# Patient Record
Sex: Female | Born: 1990 | Race: Black or African American | Hispanic: No | Marital: Single | Smoking: Never smoker
Health system: Southern US, Community
[De-identification: ages and names within clinical notes are randomized; demographics above are authoritative.]

---
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
## 2016-07-25 ENCOUNTER — Emergency Department (HOSPITAL_BASED_OUTPATIENT_CLINIC_OR_DEPARTMENT_OTHER): Payer: Medicaid Other

## 2016-07-25 ENCOUNTER — Emergency Department (HOSPITAL_BASED_OUTPATIENT_CLINIC_OR_DEPARTMENT_OTHER)
Admission: EM | Admit: 2016-07-25 | Discharge: 2016-07-25 | Disposition: A | Discharge status: Home / Self Care | Payer: Medicaid Other | Attending: Emergency Medicine | Admitting: Emergency Medicine

## 2016-07-25 ENCOUNTER — Encounter (HOSPITAL_BASED_OUTPATIENT_CLINIC_OR_DEPARTMENT_OTHER): Payer: Self-pay | Admitting: Emergency Medicine

## 2016-07-25 DIAGNOSIS — Z3A01 Less than 8 weeks gestation of pregnancy: Secondary | ICD-10-CM | POA: Insufficient documentation

## 2016-07-25 DIAGNOSIS — O30041 Twin pregnancy, dichorionic/diamniotic, first trimester: Secondary | ICD-10-CM | POA: Insufficient documentation

## 2016-07-25 DIAGNOSIS — O9989 Other specified diseases and conditions complicating pregnancy, childbirth and the puerperium: Secondary | ICD-10-CM | POA: Diagnosis not present

## 2016-07-25 DIAGNOSIS — R109 Unspecified abdominal pain: Secondary | ICD-10-CM | POA: Diagnosis present

## 2016-07-25 LAB — URINALYSIS, ROUTINE W REFLEX MICROSCOPIC
Bilirubin Urine: NEGATIVE
Glucose, UA: NEGATIVE mg/dL
HGB URINE DIPSTICK: NEGATIVE
Ketones, ur: NEGATIVE mg/dL
LEUKOCYTES UA: NEGATIVE
Nitrite: NEGATIVE
Protein, ur: NEGATIVE mg/dL
SPECIFIC GRAVITY, URINE: 1.022 (ref 1.005–1.030)
pH: 6.5 (ref 5.0–8.0)

## 2016-07-25 LAB — WET PREP, GENITAL
Sperm: NONE SEEN
Trich, Wet Prep: NONE SEEN
Yeast Wet Prep HPF POC: NONE SEEN

## 2016-07-25 LAB — PREGNANCY, URINE: Preg Test, Ur: POSITIVE — AB

## 2016-07-25 MED ORDER — PRENATAL COMPLETE 14-0.4 MG PO TABS: 1.0000 | ORAL_TABLET | Freq: Every day | ORAL | 0 refills | Status: AC

## 2016-07-25 NOTE — Discharge Instructions (Signed)
Begin taking the prenatal vitamins. Please follow-up with the OB/GYN below or another one by calling the number circled below. Please return to the emergency department if you develop any new or worsening symptoms. Congratulations on your pregnancy!

## 2016-07-25 NOTE — ED Triage Notes (Signed)
PT presents to ED with c/o cramping and had a positive pregnancy test at home and now wants a second opinion .

## 2016-07-25 NOTE — ED Provider Notes (Signed)
[facility]   CSN: [id number] Arrival date & time: 07/25/16  1959   By signing my name below, I, Clarisse GougeXavier Herndon, attest that this documentation has been prepared under the direction and in the presence of Buel ReamAlexandra Danniel Grenz, PA-C. Electronically Signed: Clarisse GougeXavier Herndon, Scribe. 07/25/16. 9:33 PM.   History   Chief Complaint Chief Complaint  Patient presents with  . Possible Pregnancy  . Abdominal Pain   The history is provided by the patient and medical records. No language interpreter was used.    Leslie Lambert is a 26 y.o. female presenting to the Emergency Department with chief complaint of mild intermittent lower abdominal cramps. She notes N/V in the afternoons occurring 1-2 times per week. She states she missed her period for the month of 06/2016, she took an OTC pregnancy at home with positive results and she comes in for a second opinion. No vaginal bleeding, fever, chills, chest pain or SOB. LNMP 05/2016. G1P0.  History reviewed. No pertinent past medical history.  There are no active problems to display for this patient.   History reviewed. No pertinent surgical history.  OB History    No data available       Home Medications    Prior to Admission medications   Medication Sig Start Date End Date Taking? Authorizing Provider  Prenatal Vit-Fe Fumarate-FA (PRENATAL COMPLETE) 14-0.4 MG TABS Take 1 tablet by mouth daily. 07/25/16   Emi HolesLaw, Shawntrice Salle M, PA-C    Family History No family history on file.  Social History Social History  Substance Use Topics  . Smoking status: Not on file  . Smokeless tobacco: Not on file  . Alcohol use Not on file     Allergies   Patient has no known allergies.   Review of Systems Review of Systems  Constitutional: Negative for chills and fever.  Respiratory: Negative for shortness of breath.   Cardiovascular: Negative for chest pain.  Gastrointestinal: Positive for abdominal pain, nausea and vomiting.    Genitourinary: Negative for vaginal bleeding.  All other systems reviewed and are negative.    Physical Exam Updated Vital Signs BP (!) 144/79 (BP Location: Left Arm)   Pulse (!) 112   Temp 98.8 F (37.1 C) (Oral)   Resp 19   LMP 06/11/2016   SpO2 99%   Physical Exam  Constitutional: She appears well-developed and well-nourished. No distress.  HENT:  Head: Normocephalic and atraumatic.  Mouth/Throat: Oropharynx is clear and moist. No oropharyngeal exudate.  Eyes: Conjunctivae are normal. Pupils are equal, round, and reactive to light. Right eye exhibits no discharge. Left eye exhibits no discharge. No scleral icterus.  Neck: Normal range of motion. Neck supple. No thyromegaly present.  Cardiovascular: Normal rate, regular rhythm, normal heart sounds and intact distal pulses.  Exam reveals no gallop and no friction rub.   No murmur heard. Pulmonary/Chest: Effort normal and breath sounds normal. No stridor. No respiratory distress. She has no wheezes. She has no rales.  Abdominal: Soft. Bowel sounds are normal. She exhibits no distension. There is no tenderness. There is no rebound and no guarding.  Genitourinary: Uterus normal. Cervix exhibits no motion tenderness. Right adnexum displays no mass and no tenderness. Left adnexum displays no mass and no tenderness. Vaginal discharge (white) found.  Musculoskeletal: She exhibits no edema.  Lymphadenopathy:    She has no cervical adenopathy.  Neurological: She is alert. Coordination normal.  Skin: Skin is warm and dry. No rash noted. She is not diaphoretic. No pallor.  Psychiatric: She has a normal mood and affect.  Nursing note and vitals reviewed.    ED Treatments / Results  DIAGNOSTIC STUDIES: Oxygen Saturation is 99% on RA, NL by my interpretation.    COORDINATION OF CARE: 9:33 PM-Discussed next steps with pt. Pt verbalized understanding and is agreeable with the plan. Pt prepared for pelvic exam.   Labs (all labs ordered  are listed, but only abnormal results are displayed) Labs Reviewed  WET PREP, GENITAL - Abnormal; Notable for the following:       Result Value   Clue Cells Wet Prep HPF POC PRESENT (*)    WBC, Wet Prep HPF POC MODERATE (*)    All other components within normal limits  PREGNANCY, URINE - Abnormal; Notable for the following:    Preg Test, Ur POSITIVE (*)    All other components within normal limits  URINALYSIS, ROUTINE W REFLEX MICROSCOPIC  RPR  HIV ANTIBODY (ROUTINE TESTING)  GC/CHLAMYDIA PROBE AMP ([place]) NOT AT [facility]    EKG  EKG Interpretation None       Radiology US Ob Comp < 14 Wks  Result Date: 07/25/2016 CLINICAL DATA:  Diffuse midline cramping EXAM: TWIN OBSTETRIC <14WK [place] AND TRANSVAGINAL OB US COMPARISON:  None. FINDINGS: Number of IUPs:  2 Chorionicity/Amnionicity:  Dichorionic diamniotic TWIN 1 Yolk sac:  Visualized Embryo:  Visualized Cardiac Activity: Visualized Heart Rate: 124 bpm CRL:  3.9  mm   6 w 0 d                  [place] EDC: 03/20/2017 TWIN 2 Yolk sac:  Visualized Embryo:  Visualized Cardiac Activity: Visualized Heart Rate: 124 bpm CRL:  6.4  mm   6 w 3 d                  [place] EDC: 03/17/2017 Subchorionic hemorrhage: Small subchorionic hemorrhage along the right inferior aspect of the gestational sacs. Maternal uterus/adnexae: Right ovary measures 4.2 x 4.7 x 2.2 cm. Possible hemorrhagic luteal cyst with peripheral vascularity, measuring 2.3 x 1.8 x 2.1 cm. The left ovary measures 3.2 by 5.2 x 1.8 cm. Adjacent to the left ovary is a bilobed cystic structure measuring 2.3 x 1.2 by 1.1 cm. Trace amount of free fluid. IMPRESSION: 1. Viable intrauterine dichorionic, diamniotic twin pregnancy with measurements as above. 2. Small amount of subchorionic hemorrhage.  Trace free fluid 3. Bilobed cystic structure adjacent to left ovary measuring 2.3 cm, possibly representing para ovarian cysts. Electronically Signed   By: Jasmine Pang M.D.   On: 07/25/2016 21:35   US Ob Comp  Add'l Gest Less 14 Wks  Result Date: 07/25/2016 CLINICAL DATA:  Diffuse midline cramping EXAM: TWIN OBSTETRIC <14WK [place] AND TRANSVAGINAL OB US COMPARISON:  None. FINDINGS: Number of IUPs:  2 Chorionicity/Amnionicity:  Dichorionic diamniotic TWIN 1 Yolk sac:  Visualized Embryo:  Visualized Cardiac Activity: Visualized Heart Rate: 124 bpm CRL:  3.9  mm   6 w 0 d                  [place] EDC: 03/20/2017 TWIN 2 Yolk sac:  Visualized Embryo:  Visualized Cardiac Activity: Visualized Heart Rate: 124 bpm CRL:  6.4  mm   6 w 3 d                  [place] EDC: 03/17/2017 Subchorionic hemorrhage: Small subchorionic hemorrhage along the right inferior aspect of the gestational sacs. Maternal uterus/adnexae: Right ovary measures 4.2 x  4.7 x 2.2 cm. Possible hemorrhagic luteal cyst with peripheral vascularity, measuring 2.3 x 1.8 x 2.1 cm. The left ovary measures 3.2 by 5.2 x 1.8 cm. Adjacent to the left ovary is a bilobed cystic structure measuring 2.3 x 1.2 by 1.1 cm. Trace amount of free fluid. IMPRESSION: 1. Viable intrauterine dichorionic, diamniotic twin pregnancy with measurements as above. 2. Small amount of subchorionic hemorrhage.  Trace free fluid 3. Bilobed cystic structure adjacent to left ovary measuring 2.3 cm, possibly representing para ovarian cysts. Electronically Signed   By: Jasmine Pang M.D.   On: 07/25/2016 21:35   US Ob Transvaginal  Result Date: 07/25/2016 CLINICAL DATA:  Diffuse midline cramping EXAM: TWIN OBSTETRIC <14WK [place] AND TRANSVAGINAL OB US COMPARISON:  None. FINDINGS: Number of IUPs:  2 Chorionicity/Amnionicity:  Dichorionic diamniotic TWIN 1 Yolk sac:  Visualized Embryo:  Visualized Cardiac Activity: Visualized Heart Rate: 124 bpm CRL:  3.9  mm   6 w 0 d                  [place] EDC: 03/20/2017 TWIN 2 Yolk sac:  Visualized Embryo:  Visualized Cardiac Activity: Visualized Heart Rate: 124 bpm CRL:  6.4  mm   6 w 3 d                  [place] EDC: 03/17/2017 Subchorionic hemorrhage: Small subchorionic hemorrhage  along the right inferior aspect of the gestational sacs. Maternal uterus/adnexae: Right ovary measures 4.2 x 4.7 x 2.2 cm. Possible hemorrhagic luteal cyst with peripheral vascularity, measuring 2.3 x 1.8 x 2.1 cm. The left ovary measures 3.2 by 5.2 x 1.8 cm. Adjacent to the left ovary is a bilobed cystic structure measuring 2.3 x 1.2 by 1.1 cm. Trace amount of free fluid. IMPRESSION: 1. Viable intrauterine dichorionic, diamniotic twin pregnancy with measurements as above. 2. Small amount of subchorionic hemorrhage.  Trace free fluid 3. Bilobed cystic structure adjacent to left ovary measuring 2.3 cm, possibly representing para ovarian cysts. Electronically Signed   By: Jasmine Pang M.D.   On: 07/25/2016 21:35    Procedures Procedures (including critical care time)  Medications Ordered in ED Medications - No data to display   Initial Impression / Assessment and Plan / ED Course  I have reviewed the triage vital signs and the nursing notes.  Pertinent labs & imaging results that were available during my care of the patient were reviewed by me and considered in my medical decision making (see chart for details).     Patient with viable intrauterine chorionic, diamniotic 20 pregnancy at 6 weeks 3 days; small amount of subchorionic hemorrhage, trace free fluid; bilobed cystic structure adjacent to the left ovary injuring 2.3 cm, possibly representing paraovarian cysts. Pelvic exam not significant. Wet prep shows clue cells and moderate white blood cells. Patient reports no change in vaginal discharge, so will not treat clue cells at this time. GC/chlamydia, HIV, RPR sent and pending. UA is negative. Follow up to OB/GYN for further evaluation and prenatal care. Prenatal vitamins initiated. Return precautions discussed. Patient understands and agrees with plan. Patient vitals stable. ED course discharged in satisfactory condition.  Final Clinical Impressions(s) / ED Diagnoses   Final diagnoses:    Less than [redacted] weeks gestation of pregnancy    New Prescriptions Discharge Medication List as of 07/25/2016 10:53 PM    START taking these medications   Details  Prenatal Vit-Fe Fumarate-FA (PRENATAL COMPLETE) 14-0.4 MG TABS Take 1 tablet by mouth daily., Starting  Sat 07/25/2016, Print      I personally performed the services described in this documentation, which was scribed in my presence. The recorded information has been reviewed and is accurate.    Emi Holes, PA-C 07/25/16 2301    Arby Barrette, MD 07/31/16 [phone]

## 2016-07-27 LAB — GC/CHLAMYDIA PROBE AMP (~~LOC~~) NOT AT ARMC
Chlamydia: NEGATIVE
Neisseria Gonorrhea: NEGATIVE

## 2016-07-27 LAB — RPR: RPR Ser Ql: NONREACTIVE

## 2016-07-27 LAB — HIV ANTIBODY (ROUTINE TESTING W REFLEX): HIV Screen 4th Generation wRfx: NONREACTIVE

## 2019-02-14 ENCOUNTER — Encounter (HOSPITAL_BASED_OUTPATIENT_CLINIC_OR_DEPARTMENT_OTHER): Payer: Self-pay

## 2019-02-14 ENCOUNTER — Other Ambulatory Visit: Payer: Self-pay

## 2019-02-14 ENCOUNTER — Emergency Department (HOSPITAL_BASED_OUTPATIENT_CLINIC_OR_DEPARTMENT_OTHER)
Admission: EM | Admit: 2019-02-14 | Discharge: 2019-02-14 | Disposition: A | Discharge status: Home / Self Care | Payer: Self-pay | Attending: Emergency Medicine | Admitting: Emergency Medicine

## 2019-02-14 DIAGNOSIS — N898 Other specified noninflammatory disorders of vagina: Secondary | ICD-10-CM

## 2019-02-14 DIAGNOSIS — R300 Dysuria: Secondary | ICD-10-CM | POA: Insufficient documentation

## 2019-02-14 DIAGNOSIS — B3731 Acute candidiasis of vulva and vagina: Secondary | ICD-10-CM

## 2019-02-14 DIAGNOSIS — B373 Candidiasis of vulva and vagina: Secondary | ICD-10-CM | POA: Insufficient documentation

## 2019-02-14 LAB — URINALYSIS, ROUTINE W REFLEX MICROSCOPIC
Bilirubin Urine: NEGATIVE
Glucose, UA: NEGATIVE mg/dL
Ketones, ur: NEGATIVE mg/dL
Nitrite: NEGATIVE
Protein, ur: NEGATIVE mg/dL
Specific Gravity, Urine: >1.03 — ABNORMAL HIGH (ref 1.005–1.030)
pH: 6 (ref 5.0–8.0)

## 2019-02-14 LAB — PREGNANCY, URINE: Preg Test, Ur: NEGATIVE

## 2019-02-14 LAB — WET PREP, GENITAL
Clue Cells Wet Prep HPF POC: NONE SEEN
Sperm: NONE SEEN
Trich, Wet Prep: NONE SEEN

## 2019-02-14 LAB — URINALYSIS, MICROSCOPIC (REFLEX)

## 2019-02-14 MED ORDER — FLUCONAZOLE 150 MG PO TABS
150.0000 mg | ORAL_TABLET | Freq: Once | ORAL | Status: AC
  Administered 2019-02-14: 22:00:00 150 mg via ORAL
  Filled 2019-02-14: qty 1

## 2019-02-14 MED ORDER — HYDROXYZINE HCL 25 MG PO TABS
25.0000 mg | ORAL_TABLET | Freq: Once | ORAL | Status: AC
  Administered 2019-02-14: 22:00:00 25 mg via ORAL
  Filled 2019-02-14: qty 1

## 2019-02-14 MED ORDER — FLUCONAZOLE 150 MG PO TABS: 150.0000 mg | ORAL_TABLET | Freq: Once | ORAL | 0 refills | Status: AC

## 2019-02-14 NOTE — Discharge Instructions (Addendum)
You were seen in the ER for vaginal irritation and discharge.  Swabs showed yeast infection.  I suspect the external skin irritation is from the scratching that has caused more swelling and inflammation.  You were given a one-time dose of fluconazole here, I have sent a prescription for a second dose that may be taken in 72 hours if symptoms are not improving.  Can take Benadryl 25 mg every 6 hours for itching.  Do warm soaks, tap dry and apply Vaseline to the area.  Only use water to cleanse the area and avoid any soaps.  Loose clothing until the irritation improves.  Gonorrhea and Chlamydia testing is pending, you declined treatment today for these infections.  You can follow-up on the results on MyChart, if positive you need to seek treatment for these.  Do not have sexual encounters until your symptoms have completely resolved.  Return to the ER for worsening vaginal irritation or swelling, pelvic or abdominal pain, fevers

## 2019-02-14 NOTE — ED Triage Notes (Addendum)
Pt c/o vaginal discharge and irritation x today-used OTC suppository w/o relief-NAD-steady gait

## 2019-02-14 NOTE — ED Provider Notes (Signed)
[facility] EMERGENCY DEPARTMENT Provider Note   CSN: [id number] Arrival date & time: 02/14/19  2020     History Chief Complaint  Patient presents with  . Vaginal Discharge    Leslie Lambert is a 28 y.o. female presents to the ER for evaluation of vaginal itching associated with discharge.  Onset sunday, reportedly itching is very intense.  Was in the shower today and scrubbed vaginal area with a rag and soap to help with itching.  She put in a Monistat suppository and cream externally tonight and noticed sudden, worsening itching, burning discomfort and swelling of the external vaginal area.  Discharge is white, thick.  She is concerned about being allergic to Monistat.  Years ago had a similar reaction when she applied Monistat suppository.  Last menstrual period was 12/5.  She is sexually active with men only with inconsistent condom use.  Last time she was sexually active was earlier this month, vaginal intercourse.  Has history of BV and STI in October.  Was treated with Flagyl and Diflucan for trichomonas and states she had no issues with Diflucan.  Currently she has no concerns for STI.  No fever, abdominal pelvic pain.  Has some discomfort with urination but relates it to the skin irritation. HPI     History reviewed. No pertinent past medical history.  There are no problems to display for this patient.   History reviewed. No pertinent surgical history.   OB History   No obstetric history on file.     No family history on file.  Social History   Tobacco Use  . Smoking status: Never Smoker  . Smokeless tobacco: Never Used  Substance Use Topics  . Alcohol use: Yes    Comment: occ  . Drug use: Never    Home Medications Prior to Admission medications   Medication Sig Start Date End Date Taking? Authorizing Provider  fluconazole (DIFLUCAN) 150 MG tablet Take 1 tablet (150 mg total) by mouth once for 1 dose. 02/14/19 02/14/19  Kinnie Feil, PA-C    Prenatal Vit-Fe Fumarate-FA (PRENATAL COMPLETE) 14-0.4 MG TABS Take 1 tablet by mouth daily. 07/25/16   Frederica Kuster, PA-C    Allergies    Patient has no known allergies.  Review of Systems   Review of Systems  Genitourinary: Positive for difficulty urinating, dysuria, vaginal discharge and vaginal pain.  All other systems reviewed and are negative.   Physical Exam Updated Vital Signs BP 125/84 (BP Location: Left Arm)   Pulse 99   Temp 98.9 F (37.2 C) (Oral)   Resp 18   Ht 5\' 7"  (1.702 m)   Wt 88 kg   LMP 01/28/2019   SpO2 98%   BMI 30.38 kg/m   Physical Exam Vitals and nursing note reviewed.  Constitutional:      General: She is not in acute distress.    Appearance: She is well-developed.     Comments: NAD.  HENT:     Head: Normocephalic and atraumatic.     Right Ear: External ear normal.     Left Ear: External ear normal.     Nose: Nose normal.  Eyes:     General: No scleral icterus.    Conjunctiva/sclera: Conjunctivae normal.  Cardiovascular:     Rate and Rhythm: Normal rate and regular rhythm.     Heart sounds: Normal heart sounds. No murmur.  Pulmonary:     Effort: Pulmonary effort is normal.     Breath sounds: Normal breath  sounds. No wheezing.  Abdominal:     Palpations: Abdomen is soft.     Tenderness: There is no abdominal tenderness.     Comments: No lower abdominal pelvic tenderness.  No suprapubic or CVA tenderness.  Genitourinary:    Vagina: Vaginal discharge present.     Comments:  External genitalia skin is edematous, erythematous with excoriation marks and very tender.  Copious amounts of thick white discharge in the introitus noted.  Discomfort with speculum insertion but patient able to tolerate exam.  Copious amounts of thick white discharge in vaginal vault, covering the cervix.  Discharge was removed and cervical os was visualized, closed without any friability, lesions, bleeding or polyps.  Swabs obtained.  No CMT.  Nonpalpable,  nontender adnexa.  Perianal skin normal.  No groin lymphadenopathy. Musculoskeletal:        General: No deformity. Normal range of motion.     Cervical back: Normal range of motion and neck supple.  Skin:    General: Skin is warm and dry.     Capillary Refill: Capillary refill takes less than 2 seconds.  Neurological:     Mental Status: She is alert and oriented to person, place, and time.  Psychiatric:        Behavior: Behavior normal.        Thought Content: Thought content normal.        Judgment: Judgment normal.     ED Results / Procedures / Treatments   Labs (all labs ordered are listed, but only abnormal results are displayed) Labs Reviewed  WET PREP, GENITAL - Abnormal; Notable for the following components:      Result Value   Yeast Wet Prep HPF POC PRESENT (*)    WBC, Wet Prep HPF POC MANY (*)    All other components within normal limits  URINALYSIS, ROUTINE W REFLEX MICROSCOPIC - Abnormal; Notable for the following components:   APPearance CLOUDY (*)    Specific Gravity, Urine >1.030 (*)    Hgb urine dipstick MODERATE (*)    Leukocytes,Ua MODERATE (*)    All other components within normal limits  URINALYSIS, MICROSCOPIC (REFLEX) - Abnormal; Notable for the following components:   Bacteria, UA MANY (*)    All other components within normal limits  URINE CULTURE  PREGNANCY, URINE  GC/CHLAMYDIA PROBE AMP ([place]) NOT AT [facility]    EKG None  Radiology No results found.  Procedures Procedures (including critical care time)  Medications Ordered in ED Medications  fluconazole (DIFLUCAN) tablet 150 mg (150 mg Oral Given 02/14/19 2222)  hydrOXYzine (ATARAX/VISTARIL) tablet 25 mg (25 mg Oral Given 02/14/19 2222)    ED Course  I have reviewed the triage vital signs and the nursing notes.  Pertinent labs & imaging results that were available during my care of the patient were reviewed by me and considered in my medical decision making (see chart for  details).  Clinical Course as of Feb 13 2221  Tue Feb 14, 2019  2205 Yeast Wet Prep HPF POC(!): PRESENT [CG]  2205 WBC, Wet Prep HPF POC(!): MANY [CG]  2217 Yeast Wet Prep HPF POC(!): PRESENT [CG]  2217 WBC, Wet Prep HPF POC(!): MANY [CG]  2217 Glori Luis): MODERATE [CG]  2217 Bacteria, UA(!): MANY [CG]  2217 Squamous Epithelial / LPF: 0-5 [CG]  2217 Urine-Other: LESS THAN 10 mL OF URINE SUBMITTED [CG]  2217 Preg Test, Ur: NEGATIVE [CG]    Clinical Course User Index [CG] Liberty Handy, PA-C   MDM Rules/Calculators/A&P  History, exam consistent with yeast vaginitis.  Wet prep confirms this.  She is sexually active with inconsistent condom use but states she is not concerned about an STI today.  Gonorrhea and chlamydia swabs obtained and pending at discharge.  She declined empiric treatment.  No CMT or adnexal fullness or tenderness.  I think her dysuria is related to the external genitalia inflammation, excoriation from her scratching earlier today.  No suprapubic or CVA tenderness.  UA has moderate leukocytes and many bacteria but this is likely contamination from vaginal discharge.  Will send urine culture.  No indication for further emergent lab work, imaging.  Will recommend symptomatic management Benadryl for itching, warm soaks, Vaseline.  First dose of fluconazole given here, prescription for repeat dose in 72 hours for persistent symptoms.  Return precautions given.  She is comfortable this. Final Clinical Impression(s) / ED Diagnoses Final diagnoses:  Yeast vaginitis  Vaginal irritation    Rx / DC Orders ED Discharge Orders         Ordered    fluconazole (DIFLUCAN) 150 MG tablet   Once     02/14/19 2222           Jerrell MylarGibbons, Jaelin Devincentis J, PA-C 02/14/19 2224    Benjiman CorePickering, Nathan, MD 02/14/19 2300

## 2019-02-16 LAB — GC/CHLAMYDIA PROBE AMP (~~LOC~~) NOT AT ARMC
Chlamydia: NEGATIVE
Neisseria Gonorrhea: NEGATIVE

## 2019-02-16 LAB — URINE CULTURE: Culture: 50000 — AB

## 2019-07-30 ENCOUNTER — Encounter (HOSPITAL_BASED_OUTPATIENT_CLINIC_OR_DEPARTMENT_OTHER): Payer: Self-pay | Admitting: Emergency Medicine

## 2019-07-30 ENCOUNTER — Emergency Department (HOSPITAL_BASED_OUTPATIENT_CLINIC_OR_DEPARTMENT_OTHER): Payer: Self-pay

## 2019-07-30 ENCOUNTER — Emergency Department (HOSPITAL_BASED_OUTPATIENT_CLINIC_OR_DEPARTMENT_OTHER)
Admission: EM | Admit: 2019-07-30 | Discharge: 2019-07-30 | Disposition: A | Discharge status: Home / Self Care | Payer: Self-pay | Attending: Emergency Medicine | Admitting: Emergency Medicine

## 2019-07-30 ENCOUNTER — Other Ambulatory Visit: Payer: Self-pay

## 2019-07-30 DIAGNOSIS — M25562 Pain in left knee: Secondary | ICD-10-CM | POA: Insufficient documentation

## 2019-07-30 DIAGNOSIS — Y9389 Activity, other specified: Secondary | ICD-10-CM | POA: Insufficient documentation

## 2019-07-30 DIAGNOSIS — Y998 Other external cause status: Secondary | ICD-10-CM | POA: Insufficient documentation

## 2019-07-30 DIAGNOSIS — S8992XA Unspecified injury of left lower leg, initial encounter: Secondary | ICD-10-CM

## 2019-07-30 DIAGNOSIS — W1830XA Fall on same level, unspecified, initial encounter: Secondary | ICD-10-CM | POA: Insufficient documentation

## 2019-07-30 DIAGNOSIS — Y9289 Other specified places as the place of occurrence of the external cause: Secondary | ICD-10-CM | POA: Insufficient documentation

## 2019-07-30 MED ORDER — IBUPROFEN 400 MG PO TABS
600.0000 mg | ORAL_TABLET | Freq: Once | ORAL | Status: AC
  Administered 2019-07-30: 600 mg via ORAL
  Filled 2019-07-30: qty 1

## 2019-07-30 NOTE — Discharge Instructions (Addendum)
There is concern for possible ligament injury in your knee.  Please avoid weight bearing, wear the brace at all times expect while bathing. Apply ice for 20 minutes at a time and elevate your leg as much as possible. Take 600mg  of ibuprofen every 6 hours for pain and swelling. Schedule an appointment with orthopedics for follow up of your injury.

## 2019-07-30 NOTE — ED Provider Notes (Signed)
[facility] EMERGENCY DEPARTMENT Provider Note   CSN: [id number] Arrival date & time: 07/30/19  1238     History Chief Complaint  Patient presents with  . Knee Injury    Leslie Lambert is a 29 y.o. female presenting to the emergency department with left knee injury that occurred prior to arrival.  Patient states she was stepping down off of the porch when she fell.  Her leg folded under her with her knee bent as if she were to sit down with her legs crossed.  She states that she heard a popping noise when that occurred and when she stood up and put weight on it she heard another popping noise.  Her pain is mostly located anteriorly.  It is worse with movement and palpation.  No prior injuries to left knee.  No interventions tried prior to arrival.  Of note, patient was noted to become dizzy and diaphoretic in triage with hypotension.  This has resolved on evaluation.  The history is provided by the patient.       History reviewed. No pertinent past medical history.  There are no problems to display for this patient.   History reviewed. No pertinent surgical history.   OB History   No obstetric history on file.     No family history on file.  Social History   Tobacco Use  . Smoking status: Never Smoker  . Smokeless tobacco: Never Used  Substance Use Topics  . Alcohol use: Yes    Comment: occ  . Drug use: Never    Home Medications Prior to Admission medications   Medication Sig Start Date End Date Taking? Authorizing Provider  Prenatal Vit-Fe Fumarate-FA (PRENATAL COMPLETE) 14-0.4 MG TABS Take 1 tablet by mouth daily. 07/25/16   Emi Holes, PA-C    Allergies    Patient has no known allergies.  Review of Systems   Review of Systems  All other systems reviewed and are negative.   Physical Exam Updated Vital Signs BP 104/69 (BP Location: Left Arm)   Pulse 79   Temp 98.8 F (37.1 C) (Oral)   Resp 20   Ht 5\' 7"  (1.702 m)   Wt 86.2 kg    LMP 07/23/2019   SpO2 100%   BMI 29.76 kg/m   Physical Exam Vitals and nursing note reviewed.  Constitutional:      General: She is not in acute distress.    Appearance: She is well-developed.  HENT:     Head: Normocephalic and atraumatic.  Eyes:     Conjunctiva/sclera: Conjunctivae normal.  Cardiovascular:     Rate and Rhythm: Normal rate.  Pulmonary:     Effort: Pulmonary effort is normal.  Musculoskeletal:     Comments: Left knee with mild swelling present.  No deformity or wounds.  There is some tenderness with palpation of the medial lateral joint lines.  No pain with movement of the patella or along the patella tendon.  Patient is able to flex the knee to about 30 degrees.  Special tests performed including anterior and posterior drawer test as well as valgus and varus.  There is appear to be some increased laxity with anterior drawer test though posterior drawer test does cause more pain.  Neurological:     Mental Status: She is alert.  Psychiatric:        Mood and Affect: Mood normal.        Behavior: Behavior normal.     ED Results / Procedures /  Treatments   Labs (all labs ordered are listed, but only abnormal results are displayed) Labs Reviewed - No data to display  EKG None  Radiology DG Knee Complete 4 Views Left  Result Date: 07/30/2019 CLINICAL DATA:  [place], heard and felt a pop EXAM: LEFT KNEE - COMPLETE 4+ VIEW COMPARISON:  None FINDINGS: Osseous mineralization normal. Joint spaces preserved. No acute fracture, dislocation, or bone destruction. No joint effusion. IMPRESSION: Normal exam. Electronically Signed   By: Lavonia Dana M.D.   On: 07/30/2019 13:37    Procedures Procedures (including critical care time)  Medications Ordered in ED Medications  ibuprofen (ADVIL) tablet 600 mg (has no administration in time range)    ED Course  I have reviewed the triage vital signs and the nursing notes.  Pertinent labs & imaging results that were available  during my care of the patient were reviewed by me and considered in my medical decision making (see chart for details).    MDM Rules/Calculators/A&P                      Patient with left knee injury after mechanical fall that occurred prior to arrival.  Concern for possible internal derangement with some increased laxity with anterior posterior drawer test.  X-rays negative.  Knee placed in immobilizer brace, crutches for nonweightbearing.  Discussed symptom management including RICE therapy and NSAIDs.  Provided orthopedic referral for follow-up.  Patient may need outpatient MRI.  Agreeable to plan, no distress, safe for discharge.  Discussed results, findings, treatment and follow up. Patient advised of return precautions. Patient verbalized understanding and agreed with plan.  Final Clinical Impression(s) / ED Diagnoses Final diagnoses:  Injury of left knee, initial encounter    Rx / DC Orders ED Discharge Orders    None       Wong Steadham, [place] N, PA-C 07/30/19 1406    Davonna Belling, MD 07/30/19 1506

## 2019-07-30 NOTE — ED Notes (Signed)
Pt became diaphoretic during triage, c/o dizziness, noted to be hypotensive.

## 2019-07-30 NOTE — ED Triage Notes (Signed)
L knee injury today, she missed a step and fell.

## 2020-07-26 IMAGING — CR DG KNEE COMPLETE 4+V*L*
4 series · 4 of 4 positions shown · non-contrast
Comparison: None

CLINICAL DATA: Fell, heard and felt a pop

EXAM:
LEFT KNEE - COMPLETE 4+ VIEW

[t knee ap left]
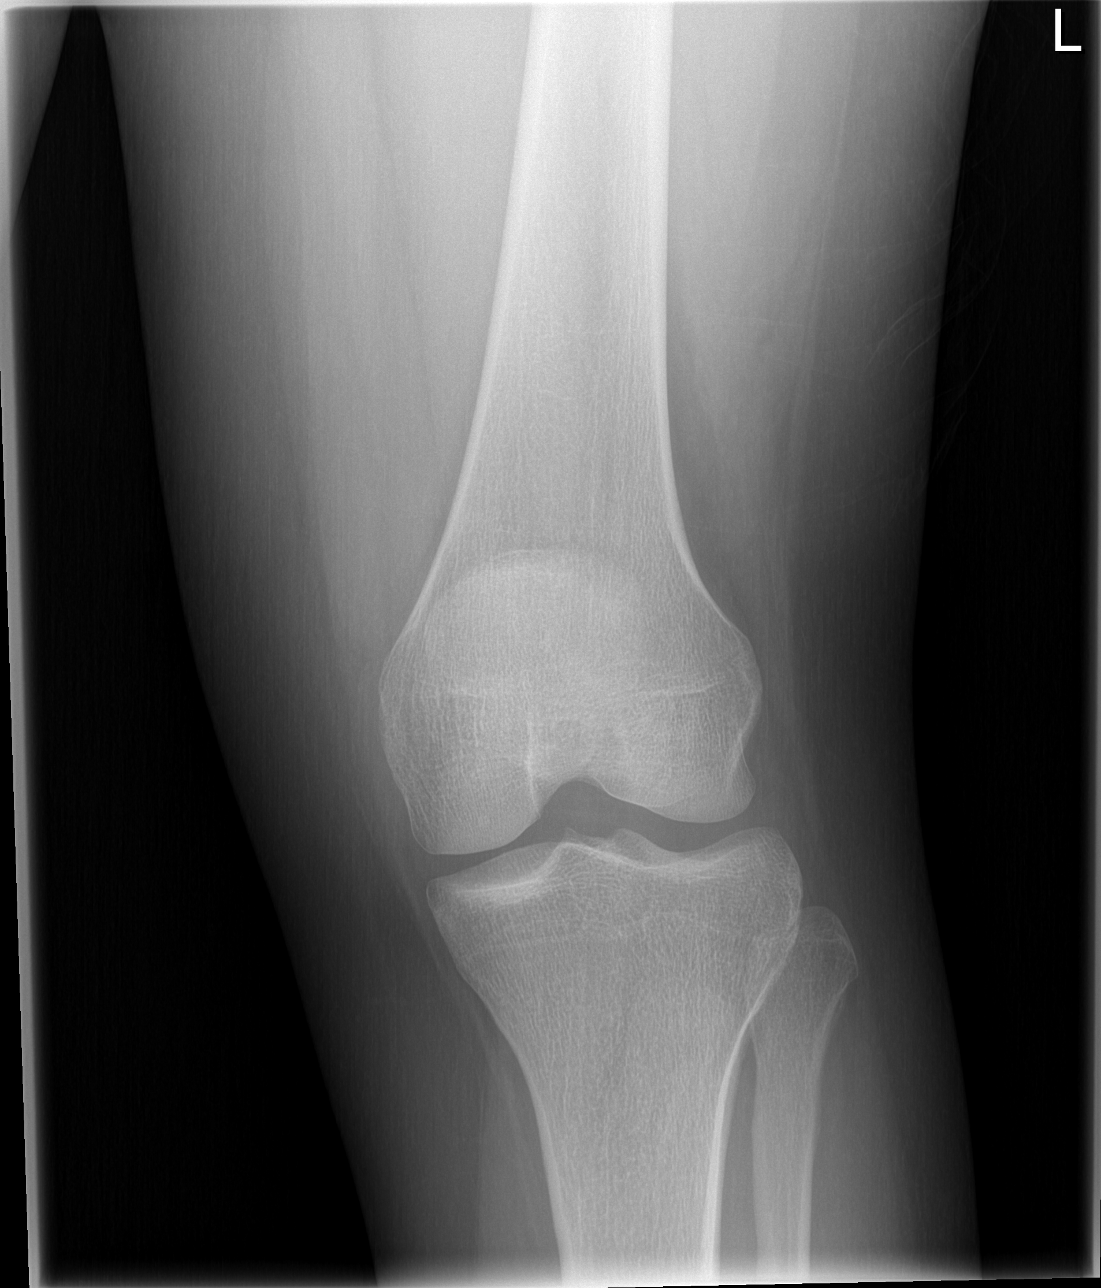

[t knee oblique left (1 of 2)]
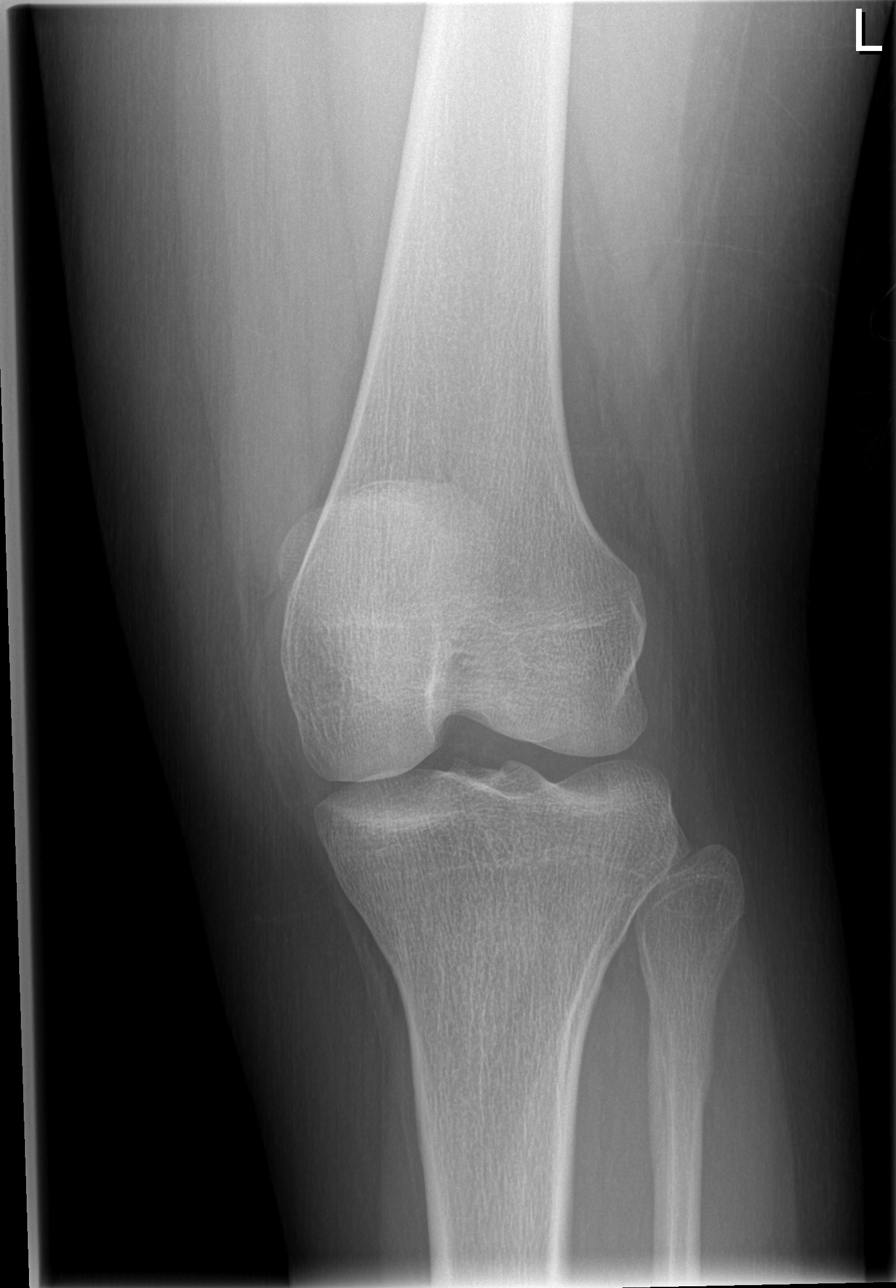

[t knee oblique left (2 of 2)]
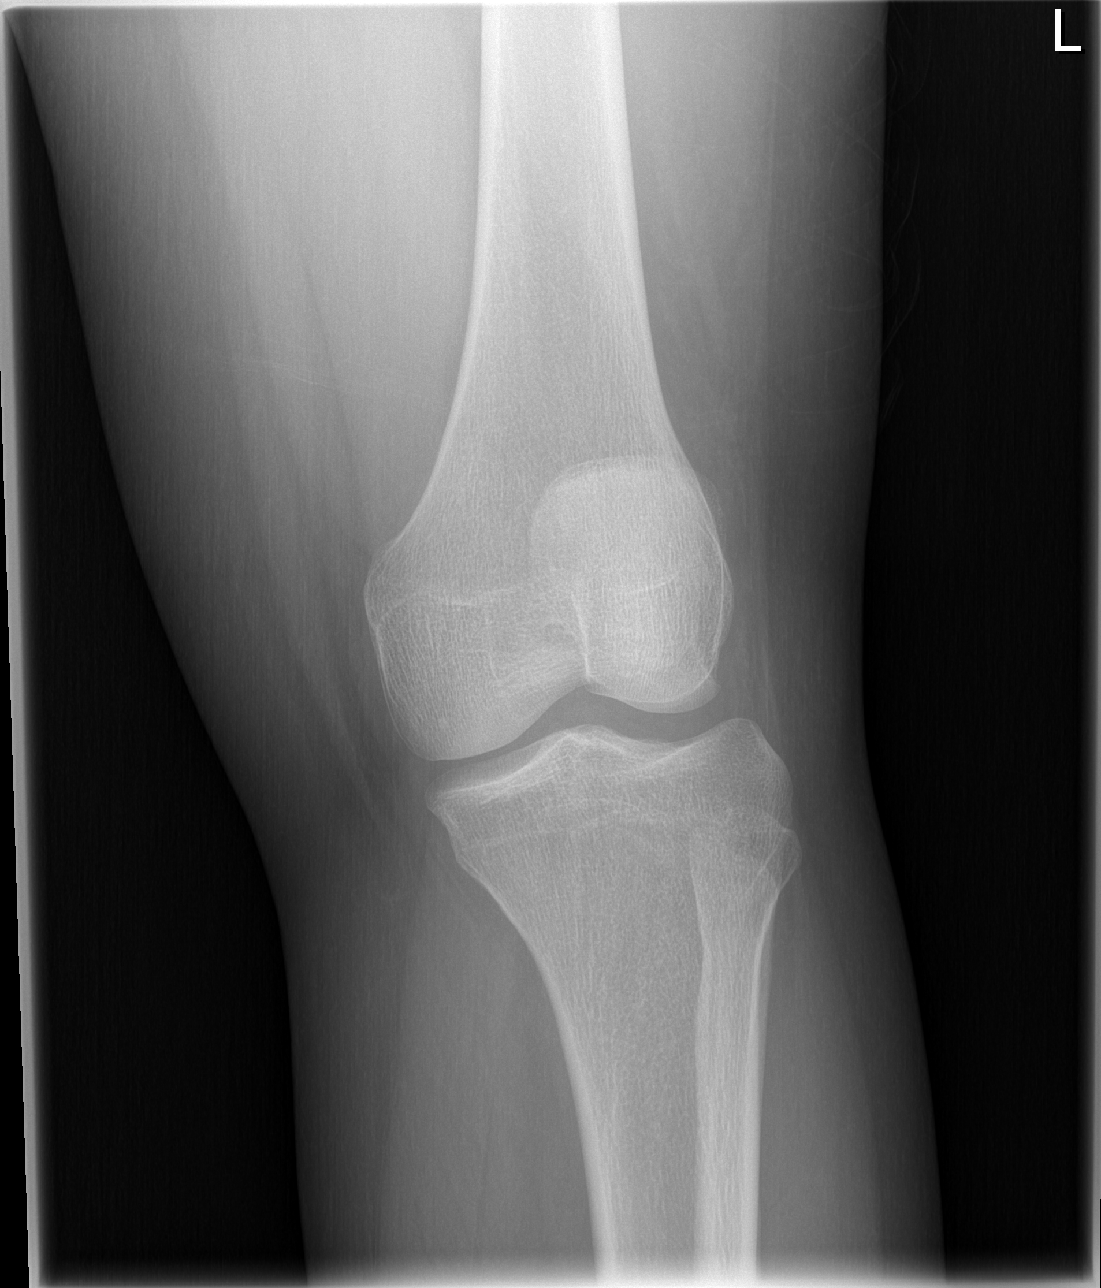

[t knee lat left]
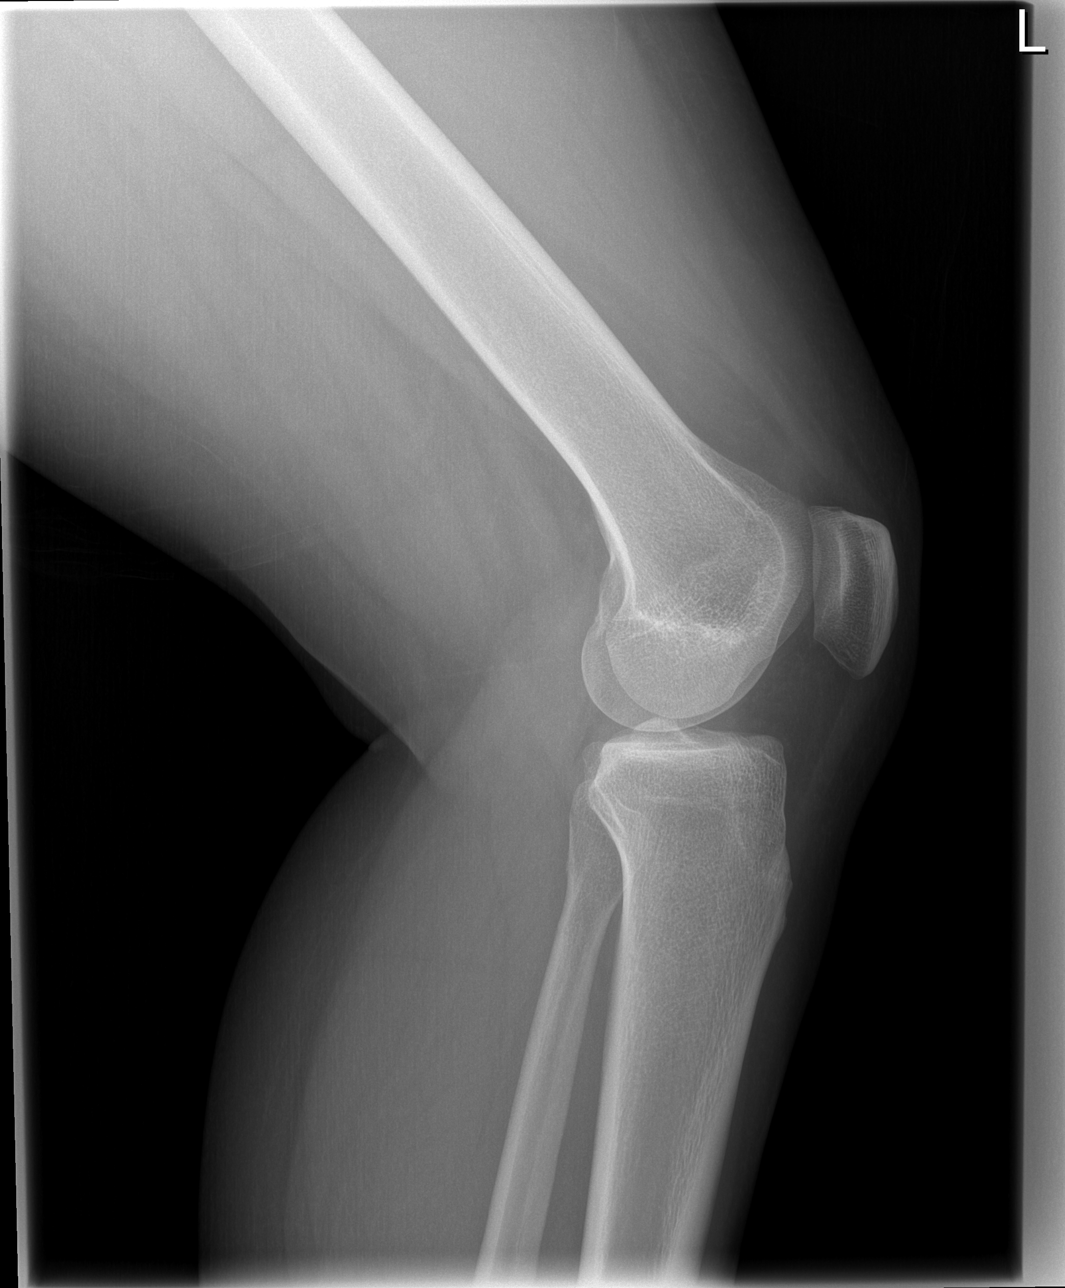

[4 of 4 positions shown; findings below may reference images not displayed]

FINDINGS: Osseous mineralization normal.

Joint spaces preserved.

No acute fracture, dislocation, or bone destruction.

No joint effusion.
IMPRESSION: Normal exam.

## 2020-08-20 ENCOUNTER — Encounter (HOSPITAL_BASED_OUTPATIENT_CLINIC_OR_DEPARTMENT_OTHER): Payer: Self-pay | Admitting: *Deleted

## 2020-08-20 ENCOUNTER — Emergency Department (HOSPITAL_BASED_OUTPATIENT_CLINIC_OR_DEPARTMENT_OTHER): Payer: Medicaid Other

## 2020-08-20 ENCOUNTER — Other Ambulatory Visit: Payer: Self-pay

## 2020-08-20 ENCOUNTER — Emergency Department (HOSPITAL_BASED_OUTPATIENT_CLINIC_OR_DEPARTMENT_OTHER)
Admission: EM | Admit: 2020-08-20 | Discharge: 2020-08-20 | Disposition: A | Discharge status: Home / Self Care | Payer: Medicaid Other | Attending: Emergency Medicine | Admitting: Emergency Medicine

## 2020-08-20 DIAGNOSIS — O418X11 Other specified disorders of amniotic fluid and membranes, first trimester, fetus 1: Secondary | ICD-10-CM | POA: Insufficient documentation

## 2020-08-20 DIAGNOSIS — O200 Threatened abortion: Secondary | ICD-10-CM | POA: Diagnosis not present

## 2020-08-20 DIAGNOSIS — O26851 Spotting complicating pregnancy, first trimester: Secondary | ICD-10-CM | POA: Diagnosis present

## 2020-08-20 DIAGNOSIS — O4690 Antepartum hemorrhage, unspecified, unspecified trimester: Secondary | ICD-10-CM

## 2020-08-20 DIAGNOSIS — Z3A01 Less than 8 weeks gestation of pregnancy: Secondary | ICD-10-CM | POA: Insufficient documentation

## 2020-08-20 DIAGNOSIS — R1030 Lower abdominal pain, unspecified: Secondary | ICD-10-CM

## 2020-08-20 DIAGNOSIS — O468X1 Other antepartum hemorrhage, first trimester: Secondary | ICD-10-CM

## 2020-08-20 LAB — CBC WITH DIFFERENTIAL/PLATELET
Abs Immature Granulocytes: 0.02 10*3/uL (ref 0.00–0.07)
Basophils Absolute: 0 10*3/uL (ref 0.0–0.1)
Basophils Relative: 0 %
Eosinophils Absolute: 0.2 10*3/uL (ref 0.0–0.5)
Eosinophils Relative: 2 %
HCT: 37.4 % (ref 36.0–46.0)
Hemoglobin: 12.4 g/dL (ref 12.0–15.0)
Immature Granulocytes: 0 %
Lymphocytes Relative: 29 %
Lymphs Abs: 2 10*3/uL (ref 0.7–4.0)
MCH: 30.6 pg (ref 26.0–34.0)
MCHC: 33.2 g/dL (ref 30.0–36.0)
MCV: 92.3 fL (ref 80.0–100.0)
Monocytes Absolute: 0.5 10*3/uL (ref 0.1–1.0)
Monocytes Relative: 7 %
Neutro Abs: 4.3 10*3/uL (ref 1.7–7.7)
Neutrophils Relative %: 62 %
Platelets: 311 10*3/uL (ref 150–400)
RBC: 4.05 MIL/uL (ref 3.87–5.11)
RDW: 11.9 % (ref 11.5–15.5)
WBC: 7 10*3/uL (ref 4.0–10.5)
nRBC: 0 % (ref 0.0–0.2)

## 2020-08-20 LAB — BASIC METABOLIC PANEL
Anion gap: 7 (ref 5–15)
BUN: 6 mg/dL (ref 6–20)
CO2: 25 mmol/L (ref 22–32)
Calcium: 9.1 mg/dL (ref 8.9–10.3)
Chloride: 103 mmol/L (ref 98–111)
Creatinine, Ser: 0.66 mg/dL (ref 0.44–1.00)
GFR, Estimated: >60 mL/min (ref >60)
Glucose, Bld: 94 mg/dL (ref 70–99)
Potassium: 3.8 mmol/L (ref 3.5–5.1)
Sodium: 135 mmol/L (ref 135–145)

## 2020-08-20 LAB — URINALYSIS, MICROSCOPIC (REFLEX)

## 2020-08-20 LAB — WET PREP, GENITAL
Sperm: NONE SEEN
Trich, Wet Prep: NONE SEEN
Yeast Wet Prep HPF POC: NONE SEEN

## 2020-08-20 LAB — ABO/RH: ABO/RH(D): A POS

## 2020-08-20 LAB — URINALYSIS, ROUTINE W REFLEX MICROSCOPIC
Bilirubin Urine: NEGATIVE
Glucose, UA: NEGATIVE mg/dL
Ketones, ur: NEGATIVE mg/dL
Leukocytes,Ua: NEGATIVE
Nitrite: NEGATIVE
Protein, ur: NEGATIVE mg/dL
Specific Gravity, Urine: 1.015 (ref 1.005–1.030)
pH: 6 (ref 5.0–8.0)

## 2020-08-20 LAB — HCG, QUANTITATIVE, PREGNANCY: hCG, Beta Chain, Quant, S: 42671 m[IU]/mL — ABNORMAL HIGH (ref <5)

## 2020-08-20 NOTE — ED Provider Notes (Signed)
[facility] EMERGENCY DEPARTMENT Provider Note   CSN: [id number] Arrival date & time: 08/20/20  1243     History Chief Complaint  Patient presents with   Vaginal Bleeding    Leslie Lambert is a 30 y.o. female who presents to the ED today with complaint of light vaginal bleeding that she noticed earlier this morning. Pt states she noticed a small amount of blood in her underwear this morning. When she went to wipe she noticed blood as well. She also complains of light abdominal cramping. Pt reports she is approximately [redacted] weeks pregnant. LNMP 05/16. She had a positive urine pregnancy test on 06/16. G2P2 (twins). No complications with twins. No other complaints at this time.   The history is provided by the patient and medical records.      History reviewed. No pertinent past medical history.  There are no problems to display for this patient.   History reviewed. No pertinent surgical history.   OB History     Gravida  1   Para      Term      Preterm      AB      Living         SAB      IAB      Ectopic      Multiple      Live Births              No family history on file.  Social History   Tobacco Use   Smoking status: Never   Smokeless tobacco: Never  Vaping Use   Vaping Use: Never used  Substance Use Topics   Alcohol use: Yes    Comment: occ   Drug use: Never    Home Medications Prior to Admission medications   Medication Sig Start Date End Date Taking? Authorizing Provider  Prenatal Vit-Fe Fumarate-FA (PRENATAL COMPLETE) 14-0.4 MG TABS Take 1 tablet by mouth daily. 07/25/16   Emi Holes, PA-C    Allergies    Patient has no known allergies.  Review of Systems   Review of Systems  Gastrointestinal:  Positive for abdominal pain. Negative for nausea and vomiting.  Genitourinary:  Positive for vaginal bleeding. Negative for dysuria and frequency.  All other systems reviewed and are negative.  Physical Exam Updated  Vital Signs BP (!) 130/92 (BP Location: Left Arm)   Pulse 97   Temp 98.7 F (37.1 C) (Oral)   Resp 18   Ht 5\' 7"  (1.702 m)   Wt 94.7 kg   LMP 07/08/2020   SpO2 98%   BMI 32.70 kg/m   Physical Exam Vitals and nursing note reviewed.  Constitutional:      Appearance: She is not ill-appearing.  HENT:     Head: Normocephalic and atraumatic.  Eyes:     Conjunctiva/sclera: Conjunctivae normal.  Cardiovascular:     Rate and Rhythm: Normal rate and regular rhythm.     Pulses: Normal pulses.  Pulmonary:     Effort: Pulmonary effort is normal.     Breath sounds: Normal breath sounds.  Abdominal:     Palpations: Abdomen is soft.     Tenderness: There is no abdominal tenderness. There is no guarding or rebound.  Genitourinary:    Comments: Chaperone present for exam. No rashes, lesions, or tenderness to external genitalia. No erythema, injury, or tenderness to vaginal mucosa. Mild amount of light brown blood in vault. No adnexal masses, tenderness, or fullness. No CMT, cervical  friability, or discharge from cervical os. Cervical os is slightly open at about 0.5 cm. Uterus non-deviated, mobile, nonTTP, and without enlargement.  Musculoskeletal:     Cervical back: Neck supple.  Skin:    General: Skin is warm and dry.  Neurological:     Mental Status: She is alert.    ED Results / Procedures / Treatments   Labs (all labs ordered are listed, but only abnormal results are displayed) Labs Reviewed  WET PREP, GENITAL - Abnormal; Notable for the following components:      Result Value   Clue Cells Wet Prep HPF POC PRESENT (*)    WBC, Wet Prep HPF POC MODERATE (*)    All other components within normal limits  URINALYSIS, ROUTINE W REFLEX MICROSCOPIC - Abnormal; Notable for the following components:   Hgb urine dipstick LARGE (*)    All other components within normal limits  HCG, QUANTITATIVE, PREGNANCY - Abnormal; Notable for the following components:   hCG, Beta Chain, Quant, S  42,671 (*)    All other components within normal limits  URINALYSIS, MICROSCOPIC (REFLEX) - Abnormal; Notable for the following components:   Bacteria, UA FEW (*)    All other components within normal limits  CBC WITH DIFFERENTIAL/PLATELET  BASIC METABOLIC PANEL  ABO/RH  GC/CHLAMYDIA PROBE AMP ([place]) NOT AT [facility]    EKG None  Radiology US OB LESS THAN 14 WEEKS WITH OB TRANSVAGINAL  Result Date: 08/20/2020 CLINICAL DATA:  Pelvic pain and vaginal bleeding. EXAM: OBSTETRIC <14 WK [place] AND TRANSVAGINAL OB US TECHNIQUE: Both transabdominal and transvaginal ultrasound examinations were performed for complete evaluation of the gestation as well as the maternal uterus, adnexal regions, and pelvic cul-de-sac. Transvaginal technique was performed to assess early pregnancy. COMPARISON:  None. FINDINGS: Intrauterine gestational sac: Single Yolk sac:  Visualized. Embryo:  Visualized. Cardiac Activity: Visualized. Heart Rate: 122 bpm CRL:  6 mm   6 w   3 d                  [place] EDC: 04/12/2021 Subchorionic hemorrhage:  Small subchorionic hemorrhage noted. Maternal uterus/adnexae: Small left ovarian corpus luteum is noted. Right ovary is normal in appearance. No adnexal mass or abnormal free fluid identified. IMPRESSION: Single living IUP with estimated gestational age of [redacted] weeks 3 days, and [place] EDC of 04/12/2021. Small subchorionic hemorrhage noted. Electronically Signed   By: Danae Orleans M.D.   On: 08/20/2020 15:03    Procedures Procedures   Medications Ordered in ED Medications - No data to display  ED Course  I have reviewed the triage vital signs and the nursing notes.  Pertinent labs & imaging results that were available during my care of the patient were reviewed by me and considered in my medical decision making (see chart for details).    MDM Rules/Calculators/A&P                          30 year old female who is approximately [redacted] weeks pregnant, last normal menstrual period 05/16 who  presents to the ED today complaining of light vaginal spotting that she noticed this morning as well as abdominal cramping.  This is her second pregnancy, delivered twins in the past without complication.  No other complaints at this time.  On arrival to the ED patient is afebrile, nontachycardic and nontachypneic and appears to be in no acute distress.  She has no abdominal tenderness palpation.  We will plan to  work-up for vaginal bleeding and pregnancy at this time with concern for possible ectopic.  Given patient is only about [redacted] weeks pregnant and may be too early to determine if there is a intrauterine pregnancy at this time.  We will also plan for labs and pelvic exam to assess for amount of bleeding.   CBC without leukocytosis. Hgb stable at 12.4 BMP without electrolyte abnormalities.  U/A with large hgb on dipstick however 0-5 RBC on HPF. Few bacteria however no leuks or nitrites and 0-5 WBC per HPF  Ultrasound: IMPRESSION:  Single living IUP with estimated gestational age of [redacted] weeks 3 days,  and [place] EDC of 04/12/2021.     Small subchorionic hemorrhage noted.   Pelvic exam performed; Os does appear to be about 0.5 cm open. Concern for possible threatened miscarriage at this time. Quant 74,081. Pt will need to follow up with OBGYN in 2 days for repeat quant.   ABO RH pending however per chart review with past ABO: A positive. No rhogam required Wet prep positive for clue cells however no vaginal discharge complaints. Do not feel pt requires treatment. Stable for discharge at this time.   This note was prepared using Dragon voice recognition software and may include unintentional dictation errors due to the inherent limitations of voice recognition software.   Final Clinical Impression(s) / ED Diagnoses Final diagnoses:  Vaginal bleeding in pregnancy  Threatened miscarriage  Subchorionic hemorrhage of placenta in first trimester, single or unspecified fetus    Rx / DC Orders ED  Discharge Orders     None        Discharge Instructions      Please follow up with your OBGYN in 2 days time for repeat hcg level and further evaluation of your vaginal bleeding in pregnancy. Your ultrasound showed a small amount of subchorionic bleeding as well - please mention this to your OBGYN as they may need to repeat an ultrasound for further evaluation.   Go directly to the [facility]) for any new/worsening symptoms.        Tanda Rockers, PA-C 08/20/20 1553    Terald Sleeper, MD 08/21/20 [phone]

## 2020-08-20 NOTE — ED Notes (Signed)
Patient transported to Ultrasound, will get blood work after she returns from radiology

## 2020-08-20 NOTE — ED Notes (Addendum)
Pt states she is 5-[redacted] weeks pregnant, started having vaginal bleeding this am. Has a pad on.  No c/o pain at this time

## 2020-08-20 NOTE — Discharge Instructions (Addendum)
Please follow up with your OBGYN in 2 days time for repeat hcg level and further evaluation of your vaginal bleeding in pregnancy. Your ultrasound showed a small amount of subchorionic bleeding as well - please mention this to your OBGYN as they may need to repeat an ultrasound for further evaluation.   Go directly to the [facility]) for any new/worsening symptoms.

## 2020-08-20 NOTE — ED Triage Notes (Signed)
She is [redacted] weeks pregnant. Vaginal bleeding this am. She is wearing a pad. Slight abdominal cramping.

## 2020-08-21 LAB — GC/CHLAMYDIA PROBE AMP (~~LOC~~) NOT AT ARMC
Chlamydia: NEGATIVE
Comment: NEGATIVE
Comment: NORMAL
Neisseria Gonorrhea: NEGATIVE

## 2021-08-17 IMAGING — US US OB < 14 WEEKS - US OB TV
1 series · 14 of 28 positions shown · non-contrast
Comparison: None.

CLINICAL DATA: Pelvic pain and vaginal bleeding.

EXAM:
OBSTETRIC <14 WK US AND TRANSVAGINAL OB US
TECHNIQUE: Both transabdominal and transvaginal ultrasound examinations were
performed for complete evaluation of the gestation as well as the
maternal uterus, adnexal regions, and pelvic cul-de-sac.
Transvaginal technique was performed to assess early pregnancy.

[Series 1: us ob < 14 weeks - us ob tv · 14 of 109 slices shown]
[im 5/109]
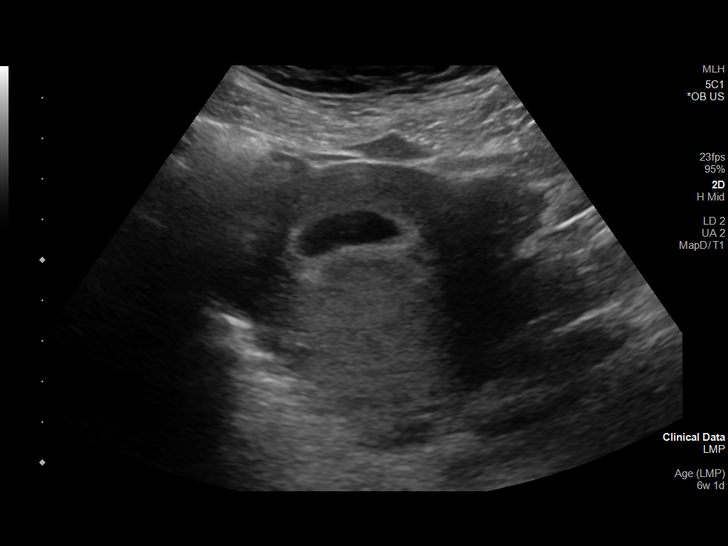
[im 13/109]
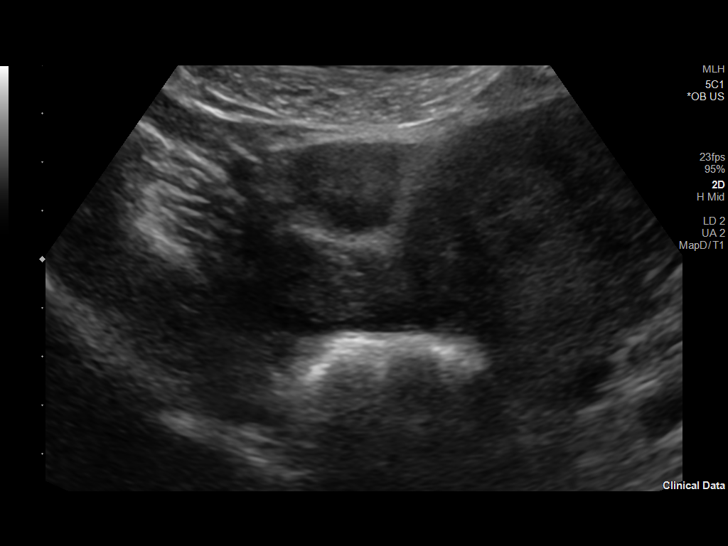
[im 21/109]
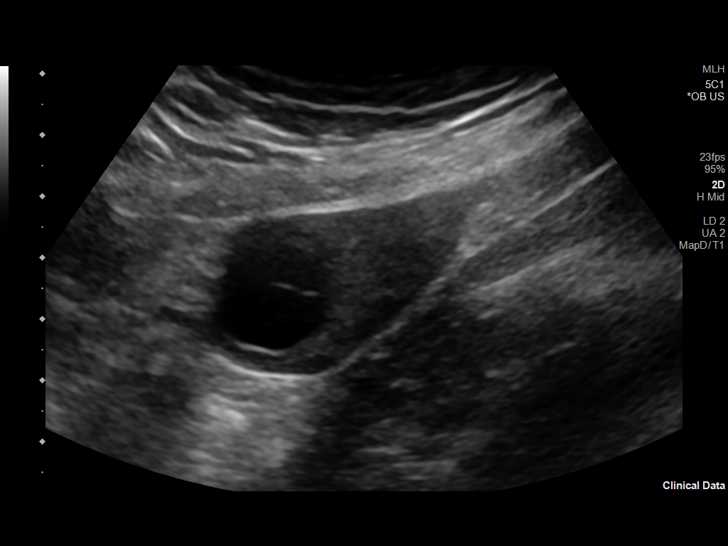
[im 29/109]
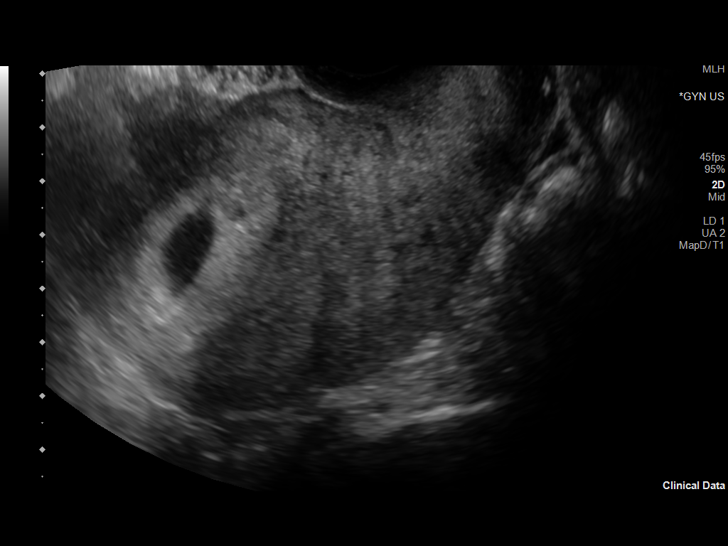
[im 37/109]
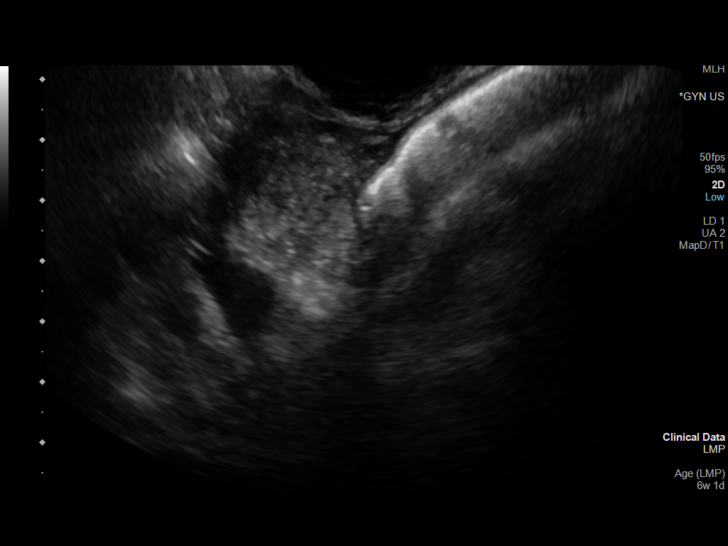
[im 45/109]
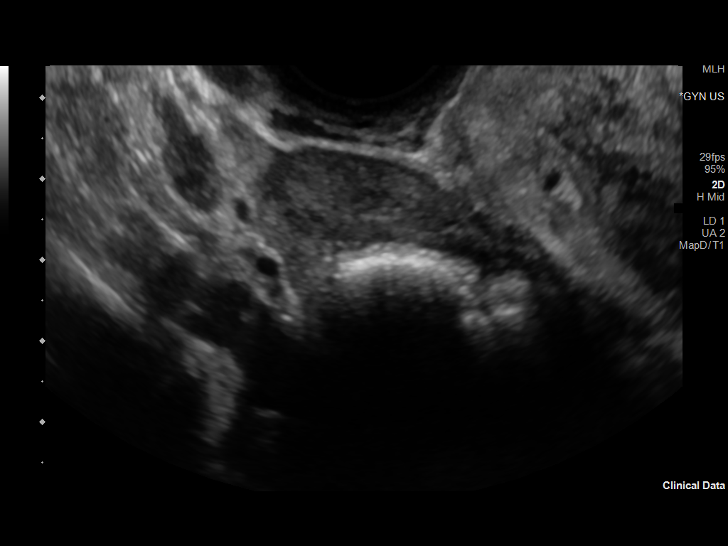
[im 53/109]
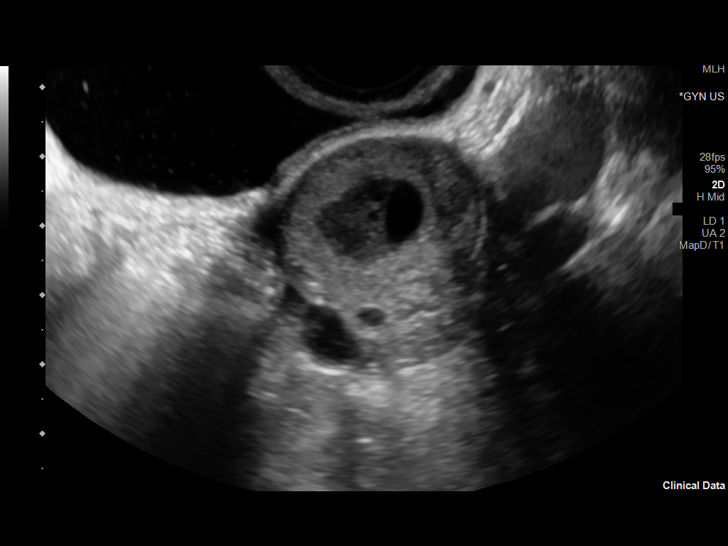
[im 61/109]
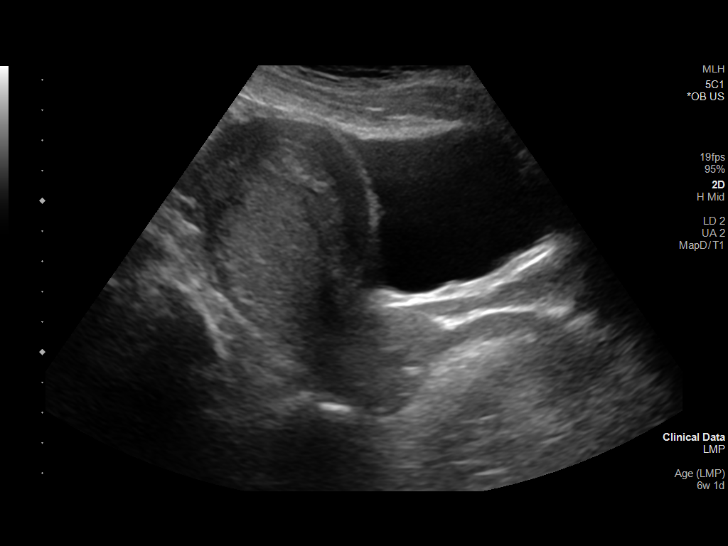
[im 69/109]
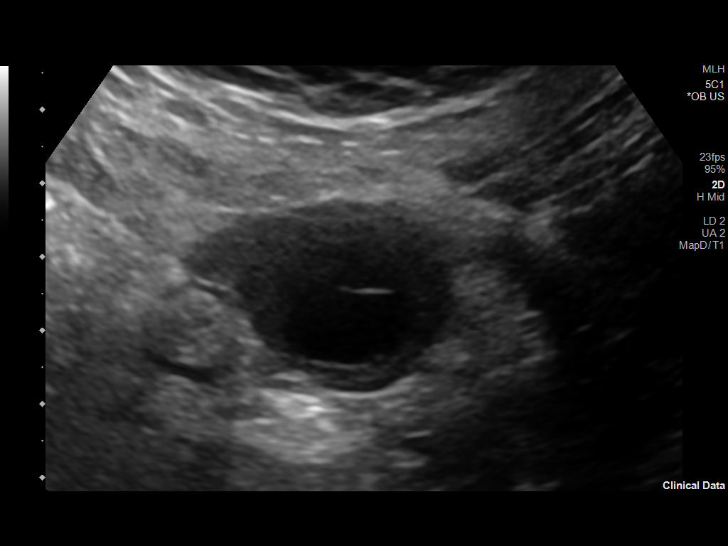
[im 77/109]
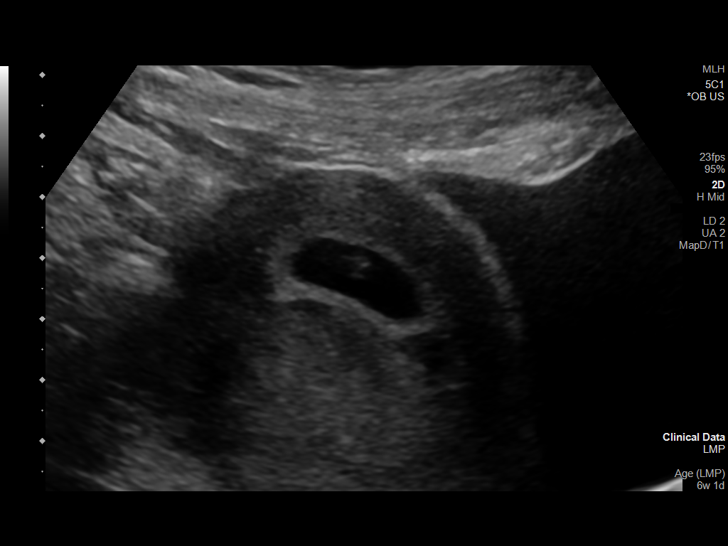
[im 85/109]
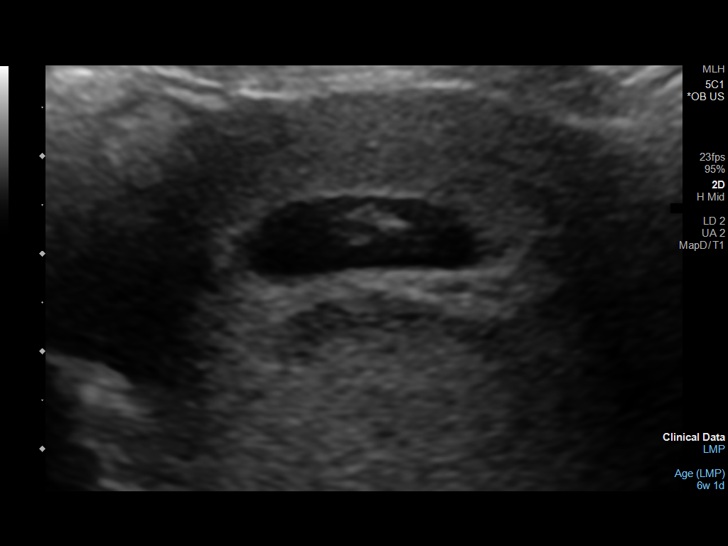
[im 93/109]
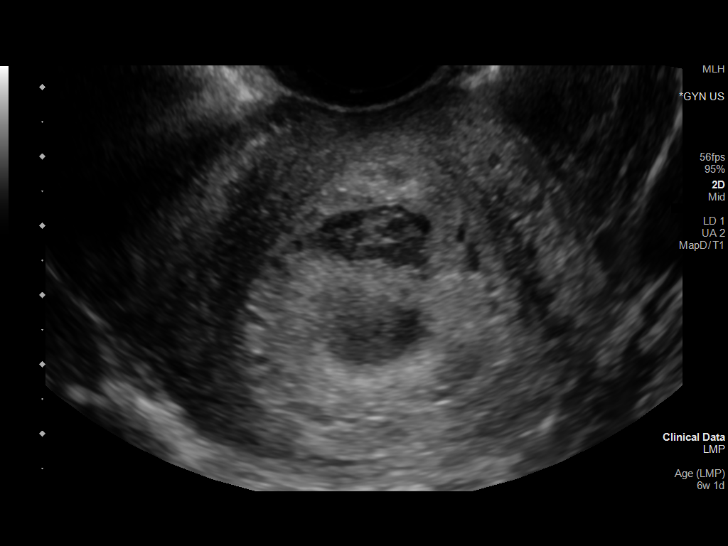
[im 101/109]
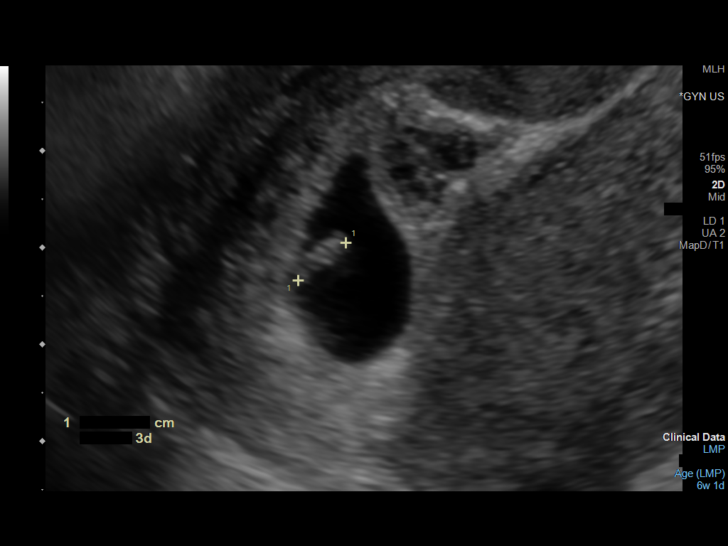
[im 109/109]
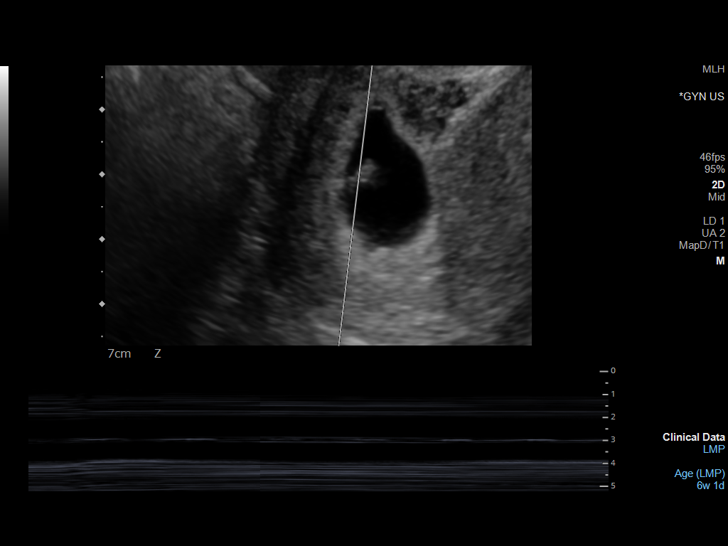

[14 of 28 positions shown; findings below may reference images not displayed]

FINDINGS: Intrauterine gestational sac: Single

Yolk sac:  Visualized.

Embryo:  Visualized.

Cardiac Activity: Visualized.

Heart Rate: 122 bpm

CRL:  6 mm   6 w   3 d                  [place] EDC: 04/12/2021

Subchorionic hemorrhage:  Small subchorionic hemorrhage noted.

Maternal uterus/adnexae: Small left ovarian corpus luteum is noted.
Right ovary is normal in appearance. No adnexal mass or abnormal
free fluid identified.
IMPRESSION: Single living IUP with estimated gestational age of 6 weeks 3 days,
and US EDC of 04/12/2021.

Small subchorionic hemorrhage noted.
